# Patient Record
Sex: Male | Born: 1965 | Race: Black or African American | Hispanic: No | Smoking: Former smoker
Health system: Southern US, Community
[De-identification: ages and names within clinical notes are randomized; demographics above are authoritative.]

## PROBLEM LIST (undated history)

## (undated) DIAGNOSIS — L729 Follicular cyst of the skin and subcutaneous tissue, unspecified: Secondary | ICD-10-CM

## (undated) DIAGNOSIS — I10 Essential (primary) hypertension: Secondary | ICD-10-CM

## (undated) HISTORY — PX: CYST EXCISION: SHX5701

---

## 1999-12-20 ENCOUNTER — Encounter: Payer: Self-pay | Admitting: Emergency Medicine

## 1999-12-20 ENCOUNTER — Emergency Department (HOSPITAL_COMMUNITY): Admission: EM | Admit: 1999-12-20 | Discharge: 1999-12-20 | Payer: Self-pay | Admitting: *Deleted

## 2005-09-24 ENCOUNTER — Emergency Department (HOSPITAL_COMMUNITY): Admission: EM | Admit: 2005-09-24 | Discharge: 2005-09-24 | Payer: Self-pay | Admitting: Family Medicine

## 2009-05-31 ENCOUNTER — Emergency Department (HOSPITAL_COMMUNITY): Admission: EM | Admit: 2009-05-31 | Discharge: 2009-05-31 | Payer: Self-pay | Admitting: Emergency Medicine

## 2009-06-09 ENCOUNTER — Ambulatory Visit: Payer: Self-pay | Admitting: Internal Medicine

## 2009-06-10 ENCOUNTER — Encounter (INDEPENDENT_AMBULATORY_CARE_PROVIDER_SITE_OTHER): Payer: Self-pay | Admitting: Internal Medicine

## 2009-07-04 ENCOUNTER — Ambulatory Visit: Payer: Self-pay | Admitting: Family Medicine

## 2009-07-04 LAB — CONVERTED CEMR LAB
ALT: 18 units/L (ref 0–53)
AST: 23 units/L (ref 0–37)
Albumin: 4.3 g/dL (ref 3.5–5.2)
Alkaline Phosphatase: 60 units/L (ref 39–117)
BUN: 10 mg/dL (ref 6–23)
Basophils Absolute: 0 10*3/uL (ref 0.0–0.1)
Basophils Relative: 0 % (ref 0–1)
CO2: 22 meq/L (ref 19–32)
Calcium: 8.9 mg/dL (ref 8.4–10.5)
Chloride: 105 meq/L (ref 96–112)
Cholesterol: 146 mg/dL (ref 0–200)
Creatinine, Ser: 0.92 mg/dL (ref 0.40–1.50)
Eosinophils Absolute: 0.1 10*3/uL (ref 0.0–0.7)
Eosinophils Relative: 1 % (ref 0–5)
Glucose, Bld: 90 mg/dL (ref 70–99)
HCT: 42.5 % (ref 39.0–52.0)
HDL: 63 mg/dL (ref >39)
Hemoglobin: 14.2 g/dL (ref 13.0–17.0)
LDL Cholesterol: 74 mg/dL (ref 0–99)
Lymphocytes Relative: 54 % — ABNORMAL HIGH (ref 12–46)
Lymphs Abs: 3 10*3/uL (ref 0.7–4.0)
MCHC: 33.4 g/dL (ref 30.0–36.0)
MCV: 88.5 fL (ref 78.0–100.0)
Monocytes Absolute: 0.3 10*3/uL (ref 0.1–1.0)
Monocytes Relative: 6 % (ref 3–12)
Neutro Abs: 2.2 10*3/uL (ref 1.7–7.7)
Neutrophils Relative %: 39 % — ABNORMAL LOW (ref 43–77)
Platelets: 225 10*3/uL (ref 150–400)
Potassium: 4.3 meq/L (ref 3.5–5.3)
RBC: 4.8 M/uL (ref 4.22–5.81)
RDW: 15.1 % (ref 11.5–15.5)
Sodium: 139 meq/L (ref 135–145)
TSH: 0.864 microintl units/mL (ref 0.350–4.500)
Total Bilirubin: 0.4 mg/dL (ref 0.3–1.2)
Total CHOL/HDL Ratio: 2.3
Total Protein: 7 g/dL (ref 6.0–8.3)
Triglycerides: 47 mg/dL (ref <150)
VLDL: 9 mg/dL (ref 0–40)
Vit D, 25-Hydroxy: 13 ng/mL — ABNORMAL LOW (ref 30–89)
WBC: 5.6 10*3/uL (ref 4.0–10.5)

## 2009-07-11 ENCOUNTER — Ambulatory Visit: Payer: Self-pay | Admitting: Family Medicine

## 2010-04-26 ENCOUNTER — Emergency Department (HOSPITAL_COMMUNITY)
Admission: EM | Admit: 2010-04-26 | Discharge: 2010-04-26 | Payer: Self-pay | Source: Home / Self Care | Admitting: Emergency Medicine

## 2010-07-25 LAB — POCT I-STAT, CHEM 8
BUN: 9 mg/dL (ref 6–23)
Calcium, Ion: 1.07 mmol/L — ABNORMAL LOW (ref 1.12–1.32)
Chloride: 108 mEq/L (ref 96–112)
Creatinine, Ser: 1.1 mg/dL (ref 0.4–1.5)
Glucose, Bld: 90 mg/dL (ref 70–99)
HCT: 46 % (ref 39.0–52.0)
Hemoglobin: 15.6 g/dL (ref 13.0–17.0)
Potassium: 4.4 mEq/L (ref 3.5–5.1)
Sodium: 141 mEq/L (ref 135–145)
TCO2: 28 mmol/L (ref 0–100)

## 2010-07-31 LAB — CBC
HCT: 42.5 % (ref 39.0–52.0)
Hemoglobin: 14.2 g/dL (ref 13.0–17.0)
MCHC: 33.5 g/dL (ref 30.0–36.0)
MCV: 94.3 fL (ref 78.0–100.0)
Platelets: 204 10*3/uL (ref 150–400)
RDW: 13.8 % (ref 11.5–15.5)
WBC: 5.6 10*3/uL (ref 4.0–10.5)

## 2010-07-31 LAB — POCT I-STAT, CHEM 8
HCT: 45 % (ref 39.0–52.0)
Hemoglobin: 15.3 g/dL (ref 13.0–17.0)
Potassium: 4.1 mEq/L (ref 3.5–5.1)
Sodium: 140 mEq/L (ref 135–145)
TCO2: 28 mmol/L (ref 0–100)

## 2012-01-12 IMAGING — CT CT ORBITS W/ CM
3 series · 17 of 47 positions shown, 20 images · IV contrast (CONTRAST)
Comparison: CT orbits of 05/31/2009

CLINICAL DATA: Right eye swelling

CT ORBITS WITH CONTRAST
TECHNIQUE: Multidetector CT imaging of the orbits was performed
following the bolus administration of intravenous contrast.
Contrast: 100 ml [id number]-O66

[Series 3: orbits bone · axial · 0.35mm/px · z∈[+160,+236]mm · 11 of 44 slices shown, 14 images]
[im 3/44  brain]
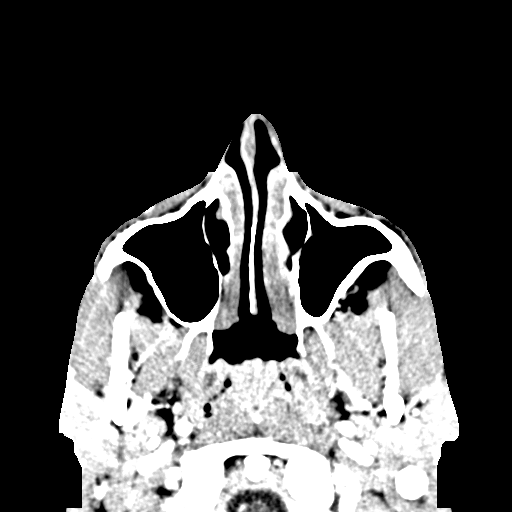
[im 3/44  bone]
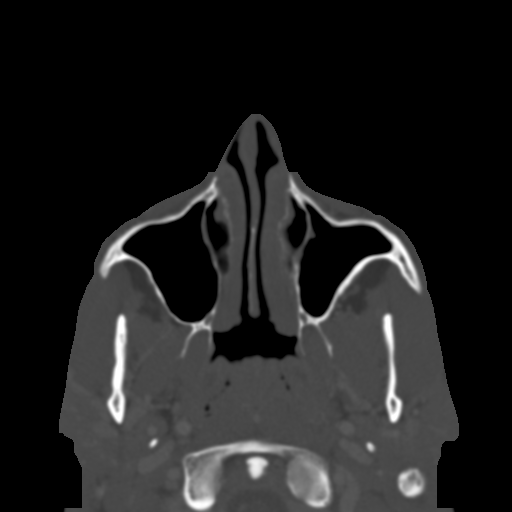
[im 6/44  bone]
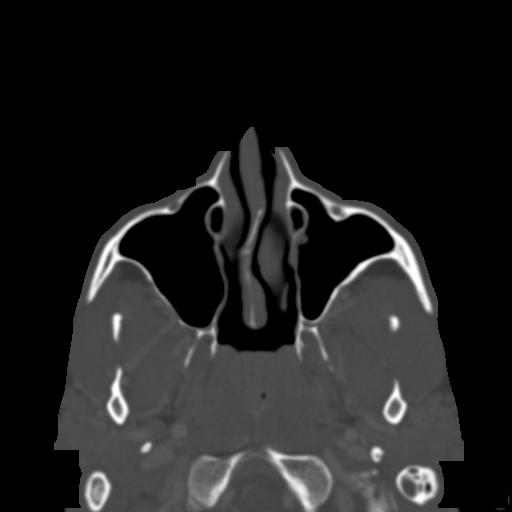
[im 11/44  bone]
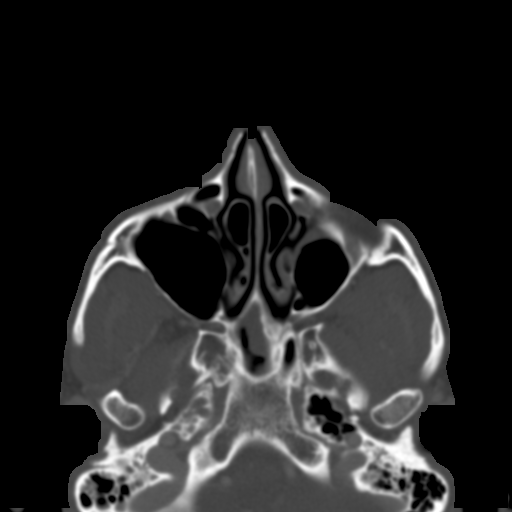
[im 14/44  bone]
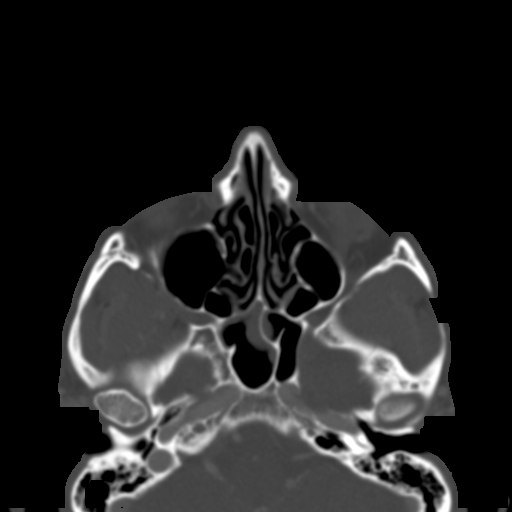
[im 18/44  brain]
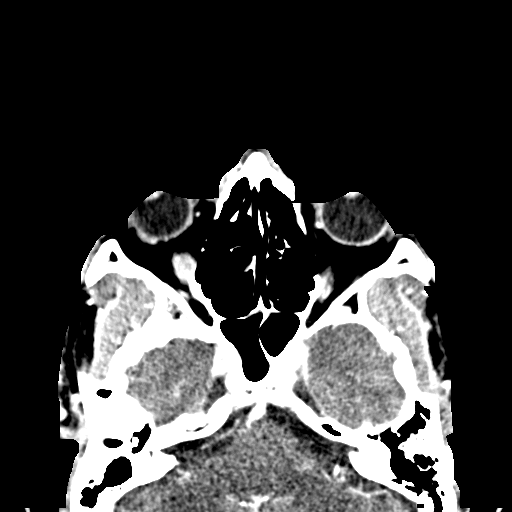
[im 18/44  bone]
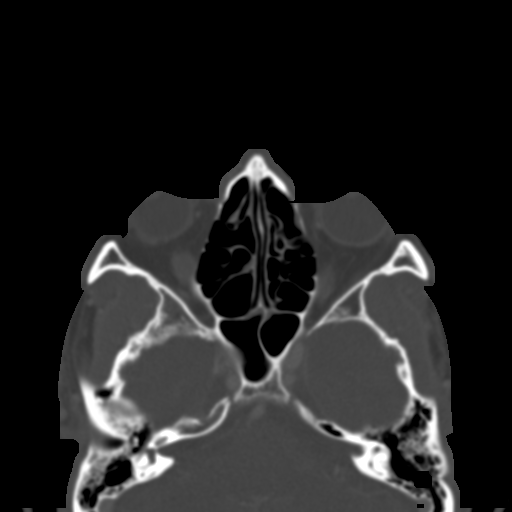
[im 23/44  bone]
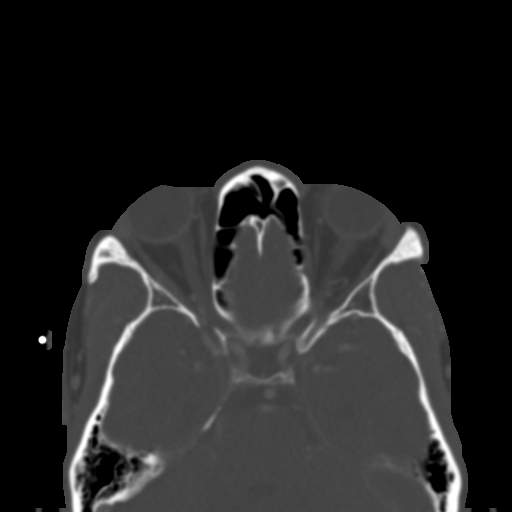
[im 26/44  bone]
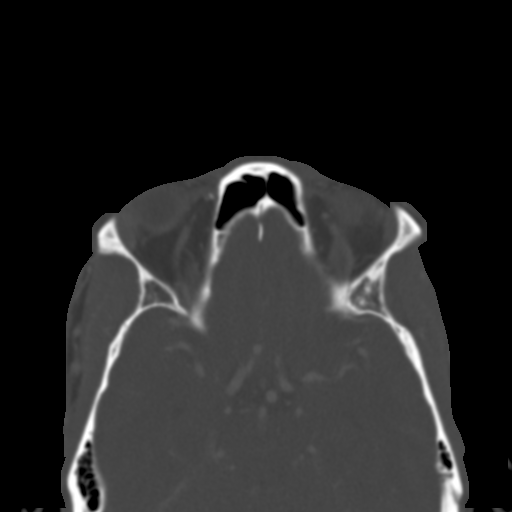
[im 30/44  bone]
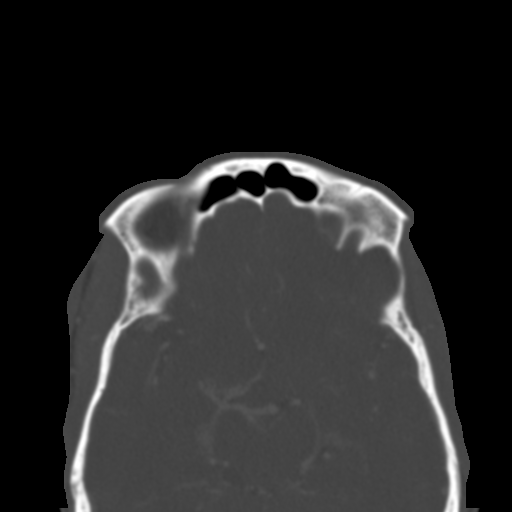
[im 33/44  brain]
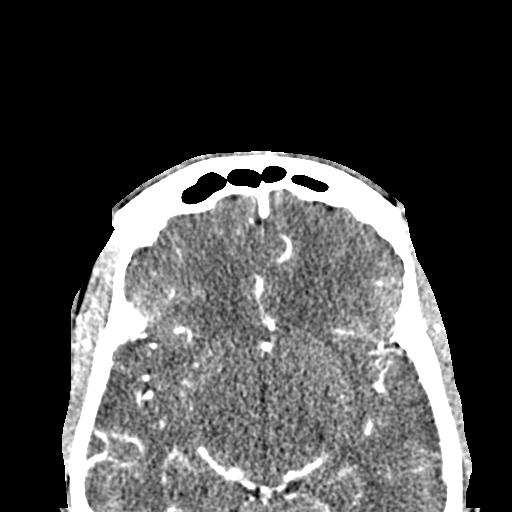
[im 33/44  bone]
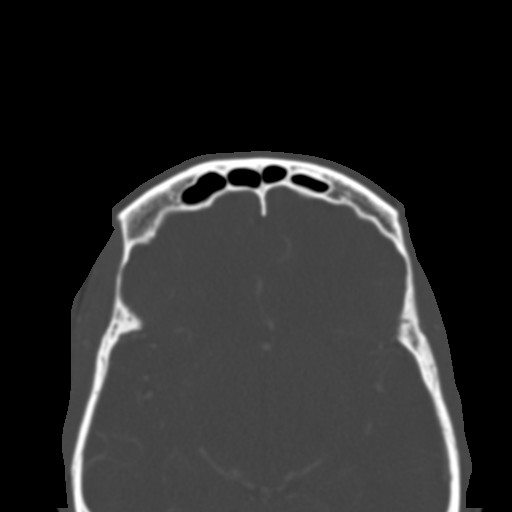
[im 38/44  bone]
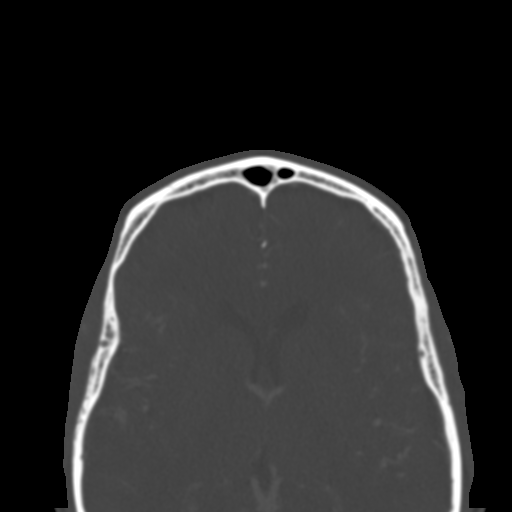
[im 41/44  bone]
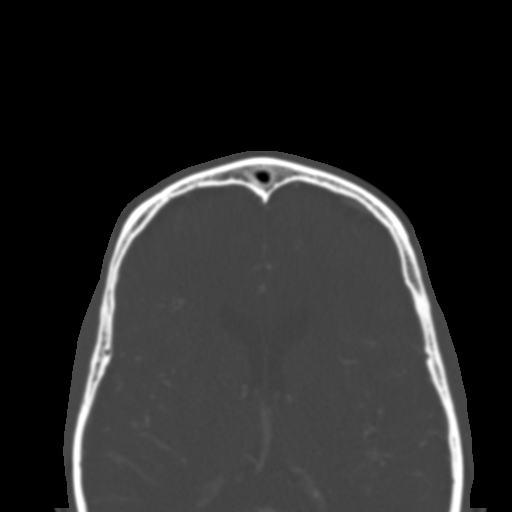

[mpr, coronal std, coronal · coronal · 0.35mm/px · 3 of 63 slices shown]
[im 21/63  bone]
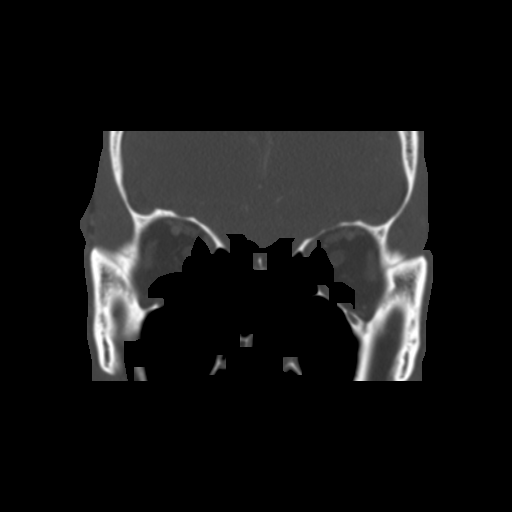
[im 28/63  bone]
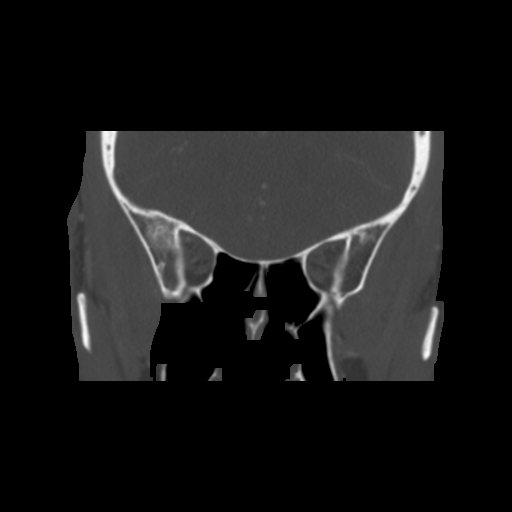
[im 35/63  bone]
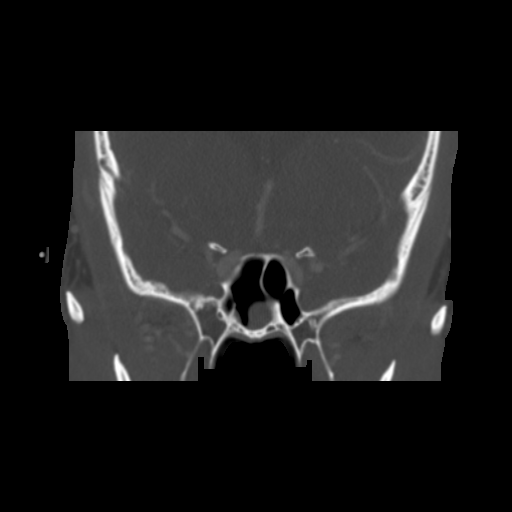

[mpr, sagittal std, sagittal · sagittal · 0.35mm/px · 3 of 72 slices shown]
[im 24/72  bone]
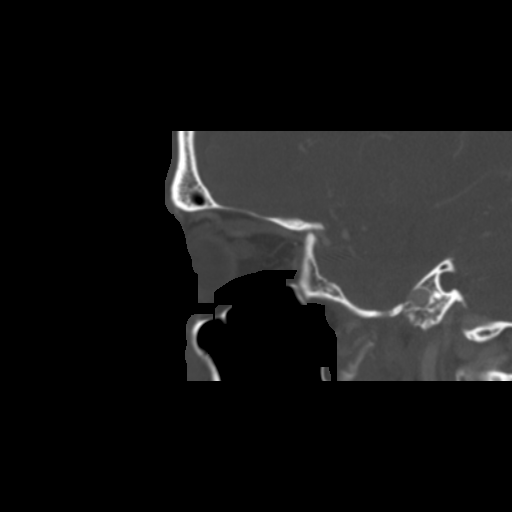
[im 36/72  bone]
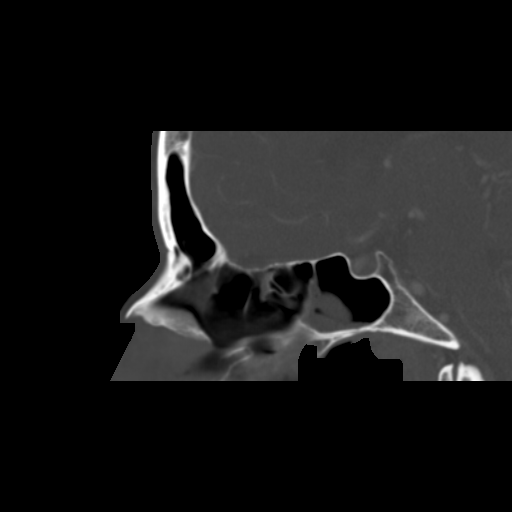
[im 48/72  bone]
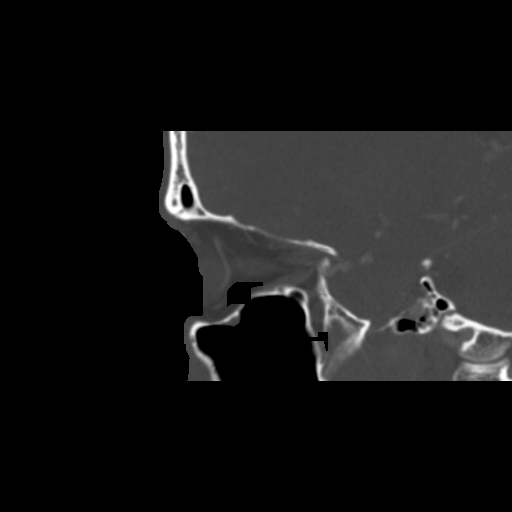

[17 of 47 positions shown; findings below may reference images not displayed]

FINDINGS: The maxillary sinuses are clear.  There is minimal
mucosal thickening in the right partition of the sphenoid sinus.
The globes appear normal and there is no evidence of post septal
cellulitis extension.  The cavernous sinus enhances with no
abnormality noted.  The ethmoid and frontal sinuses are
pneumatized.  No bony abnormality is seen.
IMPRESSION: Negative CT of the orbits.  No post septal cellulitis is seen.
Mild mucosal thickening in the sphenoid sinus.

## 2012-03-17 ENCOUNTER — Emergency Department (HOSPITAL_COMMUNITY)
Admission: EM | Admit: 2012-03-17 | Discharge: 2012-03-17 | Disposition: A | Discharge status: Home / Self Care | Payer: Self-pay | Attending: Emergency Medicine | Admitting: Emergency Medicine

## 2012-03-17 ENCOUNTER — Encounter (HOSPITAL_COMMUNITY): Payer: Self-pay | Admitting: Emergency Medicine

## 2012-03-17 DIAGNOSIS — Z87891 Personal history of nicotine dependence: Secondary | ICD-10-CM | POA: Insufficient documentation

## 2012-03-17 DIAGNOSIS — L03213 Periorbital cellulitis: Secondary | ICD-10-CM

## 2012-03-17 DIAGNOSIS — H05019 Cellulitis of unspecified orbit: Secondary | ICD-10-CM | POA: Insufficient documentation

## 2012-03-17 MED ORDER — AMOXICILLIN-POT CLAVULANATE 875-125 MG PO TABS: 1.0000 | ORAL_TABLET | Freq: Two times a day (BID) | ORAL | Status: DC

## 2012-03-17 MED ORDER — AMOXICILLIN-POT CLAVULANATE 875-125 MG PO TABS
1.0000 | ORAL_TABLET | Freq: Once | ORAL | Status: AC
  Administered 2012-03-17: 1 via ORAL
  Filled 2012-03-17: qty 1

## 2012-03-17 NOTE — ED Provider Notes (Signed)
History  This chart was scribed for Suzi Roots, MD by Shari Heritage. The patient was seen in room TR02C/TR02C. Patient's care was started at 1028.     CSN: [id number]  Arrival date & time 03/17/12  4098   First MD Initiated Contact with Patient 03/17/12 1028      Chief Complaint  Patient presents with  . Facial Swelling    The history is provided by the patient. No language interpreter was used.   HPI Comments; Jonathon Chaney is a 46 y.o. male who presents to the Emergency Department complaining of redness and swelling below left eye onset 2-3 hours ago. No  pain to eye.. Patient says that he woke up this morning and noticed symptoms, but was fine when she went to bed last night. He denies any scratches to the eye, obvious injury or trauma. Patient has a hx similar symptoms beneath right eye in past years. Patient has a hx of cysts, but only one has ever been lanced and he has not had any recently. He states that antibiotics usually provide relief of symptoms. No recent sore throat, sinus drainage, fever, chills. No nausea or vomiting. No hx MRSA. Former [facility] patient who only has intermittent primary care. Does not feel ill. No fevers. No gen malaise. No headaches.   History reviewed. No pertinent past medical history.  History reviewed. No pertinent past surgical history.  History reviewed. No pertinent family history.  History  Substance Use Topics  . Smoking status: Former Games developer  . Smokeless tobacco: Not on file  . Alcohol Use: Yes      Review of Systems  Constitutional: Negative for fever and chills.  HENT: Positive for facial swelling (left eye). Negative for congestion, sore throat and rhinorrhea.   Eyes: Negative for pain, redness and visual disturbance.  Gastrointestinal: Negative for nausea and vomiting.    Allergies  Review of patient's allergies indicates no known allergies.  Home Medications  No current outpatient prescriptions on file.  BP  135/85  Pulse 65  Temp 97.8 F (36.6 C) (Oral)  Resp 20  SpO2 99%  Physical Exam  Nursing note and vitals reviewed. Constitutional: He is oriented to person, place, and time. He appears well-developed and well-nourished. No distress.  HENT:  Head: Normocephalic and atraumatic.       Several non tender, non inflamed, subcutaneous cysts to face, largest right lower face, non tender, no erythema (pt notes present x months/years without acute change). Pt with mild cellulitis beneath left eye. No fluctuance/cyst/abscess to this area.   Eyes: Conjunctivae normal and EOM are normal. Pupils are equal, round, and reactive to light. Right eye exhibits normal extraocular motion. Left eye exhibits normal extraocular motion.       Erythema beneath the left eye. Conjunctiva normal. No pain with EOM. No eye pain itself. No proptosis.    Neck: Normal range of motion. Neck supple.  Cardiovascular: Normal rate.   Pulmonary/Chest: Effort normal.  Musculoskeletal: Normal range of motion.  Lymphadenopathy:    He has no cervical adenopathy.  Neurological: He is alert and oriented to person, place, and time.  Skin: Skin is warm and dry. No rash noted.  Psychiatric: He has a normal mood and affect. His behavior is normal.   ED Course  Procedures (including critical care time) DIAGNOSTIC STUDIES: Oxygen Saturation is 99% on room air, normal by my interpretation.    COORDINATION OF CARE: 10:33am- Patient informed of current plan for treatment and evaluation and agrees  with plan at this time.       MDM  I personally performed the services described in this documentation, which was scribed in my presence. The recorded information has been reviewed and considered. Suzi Roots, MD  Pt w hx recurrent facial cysts - reviewed prior cts from 2 years ago. Pt states cysts are always there, not painful/not tender, no infection, but on atleast a couple of occasions has developed skin redness/soreness (on right  side previously) - stating his symptoms previously resolved w po antibiotics.  Pt states has a few cystic/swollen/not painful areas, largest of which is on right lower face x months/years. Denies hx mrsa or other boils/abscesses needing drainage.   Today w mild cellulitis beneath left eye. No area of fluctuance/abscess noted. No eye pain or pain w eom - there does not appear to be any orbital involvement, but rather facial/periorbital cellulitis beneath left eye.   Confirmed nkda w pt. rx augmentin.   Suzi Roots, MD 03/17/12 1047

## 2012-03-17 NOTE — ED Notes (Signed)
Pt woke up this am to redness and edema below left eye. Pt A&Ox4, ambulatory.

## 2012-07-08 ENCOUNTER — Encounter (HOSPITAL_COMMUNITY): Payer: Self-pay | Admitting: Emergency Medicine

## 2012-07-08 ENCOUNTER — Emergency Department (HOSPITAL_COMMUNITY)
Admission: EM | Admit: 2012-07-08 | Discharge: 2012-07-08 | Disposition: A | Discharge status: Home / Self Care | Payer: Self-pay | Attending: Emergency Medicine | Admitting: Emergency Medicine

## 2012-07-08 DIAGNOSIS — Z87891 Personal history of nicotine dependence: Secondary | ICD-10-CM | POA: Insufficient documentation

## 2012-07-08 DIAGNOSIS — L729 Follicular cyst of the skin and subcutaneous tissue, unspecified: Secondary | ICD-10-CM

## 2012-07-08 DIAGNOSIS — L723 Sebaceous cyst: Secondary | ICD-10-CM | POA: Insufficient documentation

## 2012-07-08 HISTORY — DX: Follicular cyst of the skin and subcutaneous tissue, unspecified: L72.9

## 2012-07-08 MED ORDER — OXYCODONE-ACETAMINOPHEN 5-325 MG PO TABS: 1.0000 | ORAL_TABLET | ORAL | Status: DC | PRN

## 2012-07-08 MED ORDER — DOXYCYCLINE HYCLATE 100 MG PO CAPS: 100.0000 mg | ORAL_CAPSULE | Freq: Two times a day (BID) | ORAL | Status: DC

## 2012-07-08 MED ORDER — OXYCODONE-ACETAMINOPHEN 5-325 MG PO TABS
2.0000 | ORAL_TABLET | Freq: Once | ORAL | Status: AC
  Administered 2012-07-08: 2 via ORAL
  Filled 2012-07-08: qty 2

## 2012-07-08 NOTE — ED Provider Notes (Signed)
Shelda Jakes, MD   Medical screening examination/treatment/procedure(s) were conducted as a shared visit with non-physician practitioner(s) and myself.  I personally evaluated the patient during the encounter  Patient seen by me. 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 Has dermatology followup in the next one to 2 weeks. Patient has to the cyst that are chronic with acute infection on top of it on the right side of his face. Recommend not I&D in the either one will treat with antibiotics and warm compresses. Large one on the cheek is not ready for I&D the one up around the eye possibly could benefit from I&D it may drain on its own but this will help minimize scarring but not I&D normal the cyst on his face. Will treat with doxycycline warm compresses and medication for pain.  Shelda Jakes, MD 07/08/12 1325

## 2012-07-08 NOTE — ED Notes (Signed)
Pt c/o multiple cysts on face. Pt has history of cysts to face. Pt woke up with a new cyst on right side of face.

## 2012-07-21 NOTE — Progress Notes (Signed)
Local procedure-light breakfast-arrive 7am-bring ID and ins. card

## 2012-07-22 NOTE — ED Provider Notes (Signed)
History     CSN: [id number]  Arrival date & time 07/08/12  1017   First MD Initiated Contact with Patient 07/08/12 1300      Chief Complaint  Patient presents with  . other     Knots on face    (Consider location/radiation/quality/duration/timing/severity/associated sxs/prior treatment) HPI Comments: 47 y.o. Male presents with acute on chronic facial cysts that predominantly occur on his face. Today has cyst over right eyebrow and on right cheek. Has dermatology followup in the next one to 2 weeks. Pt said that the pain occurred suddenly overnight and he just couldn't bear it anymore. Pt has taken no interventions. Nothing makes it better or worse. Pain is severe. Localized.   Patient is a 47 y.o. male presenting with abscess. The history is provided by the patient.  Abscess Location:  Face Facial abscess location:  R eyebrow and R cheek Abscess quality: fluctuance and painful   Abscess quality: not draining, no redness and no warmth   Red streaking: no   Progression:  Worsening Pain details:    Quality:  Dull   Severity:  Severe   Timing:  Constant   Progression:  Worsening Chronicity:  Recurrent Relieved by:  None tried Worsened by:  Nothing tried Ineffective treatments:  None tried Associated symptoms: no fever, no headaches, no nausea and no vomiting   Risk factors: prior abscess     Past Medical History  Diagnosis Date  . Cyst of skin     Past Surgical History  Procedure Laterality Date  . Cyst excision      No family history on file.  History  Substance Use Topics  . Smoking status: Former Games developer  . Smokeless tobacco: Not on file  . Alcohol Use: Yes     Comment: mod      Review of Systems  Constitutional: Negative for fever and diaphoresis.  HENT: Negative for neck pain and neck stiffness.   Eyes: Negative for visual disturbance.  Respiratory: Negative for apnea, chest tightness and shortness of breath.   Cardiovascular: Negative for chest pain  and palpitations.  Gastrointestinal: Negative for nausea, vomiting, diarrhea and constipation.  Genitourinary: Negative for dysuria.  Musculoskeletal: Negative for gait problem.  Skin:       Two cysts on right side of face, one above eyebrow, one on right cheek  Neurological: Negative for dizziness, weakness, light-headedness, numbness and headaches.    Allergies  Review of patient's allergies indicates no known allergies.  Home Medications   Current Outpatient Rx  Name  Route  Sig  Dispense  Refill  . doxycycline (VIBRAMYCIN) 100 MG capsule   Oral   Take 1 capsule (100 mg total) by mouth 2 (two) times daily.   20 capsule   0   . oxyCODONE-acetaminophen (PERCOCET) 5-325 MG per tablet   Oral   Take 1 tablet by mouth every 4 (four) hours as needed for pain.   20 tablet   0     BP 165/93  Pulse 97  Temp(Src) 98.1 F (36.7 C) (Oral)  Resp 20  Ht 5\' 9"  (1.753 m)  Wt 155 lb (70.308 kg)  BMI 22.88 kg/m2  SpO2 96%  Physical Exam  Nursing note and vitals reviewed. Constitutional: He is oriented to person, place, and time. He appears well-developed and well-nourished. No distress.  HENT:  Head: Normocephalic and atraumatic.  Eyes: EOM are normal. Pupils are equal, round, and reactive to light.  Neck: Normal range of motion. Neck supple.  No  meningeal signs  Cardiovascular: Normal rate, regular rhythm and normal heart sounds.  Exam reveals no gallop and no friction rub.   No murmur heard. Pulmonary/Chest: Effort normal and breath sounds normal. No respiratory distress. He has no wheezes. He has no rales. He exhibits no tenderness.  Abdominal: Soft. Bowel sounds are normal. He exhibits no distension. There is no tenderness. There is no rebound and no guarding.  Musculoskeletal: Normal range of motion. He exhibits no edema and no tenderness.  Neurological: He is alert and oriented to person, place, and time. No cranial nerve deficit.  Skin: Skin is warm and dry. He is not  diaphoretic. No erythema.  One cyst over right eyebrow, one on right cheek. No warmth. No redness. Painful to touch. Fluctuant. Not purulent.     ED Course  Procedures (including critical care time)  Labs Reviewed - No data to display No results found.   1. Skin cysts, generalized       MDM  Recurrent problem for the patient. Consulted with Dr. Deretha Emory as to I&D who recommended no I&D at this time.  Will recommend antibiotics and warm compresses with the hope that they drain on their own to prevent scarring on the face. Will provided prescription for doxycycline and pain meds. Direct pt to use warm compresses and follow up with his dermatologist.  At this time there does not appear to be any evidence of an acute emergency medical condition and the patient appears stable for discharge with appropriate outpatient follow up.Diagnosis was discussed with patient who verbalizes understanding and is agreeable to discharge. Pt case discussed with Dr. Deretha Emory who agrees with the plan.       Glade Nurse, PA-C 07/22/12 [phone]

## 2012-07-22 NOTE — ED Provider Notes (Signed)
Medical screening examination/treatment/procedure(s) were conducted as a shared visit with non-physician practitioner(s) and myself.  I personally evaluated the patient during the encounter  Shelda Jakes, MD 07/22/12 [phone]

## 2012-07-24 NOTE — H&P (Signed)
PREOPERATIVE H&P  Chief Complaint: facial cyst  HPI: Jonathon Chaney is a 47 y.o. male who presents for evaluation of multiple facial cyst he's had for the last 3 years that have gradually gotten larger. He's taken to the OR for excision under local anesthesia.  Past Medical History  Diagnosis Date  . Cyst of skin    Past Surgical History  Procedure Laterality Date  . Cyst excision     History   Social History  . Marital Status: Legally Separated    Spouse Name: N/A    Number of Children: N/A  . Years of Education: N/A   Social History Main Topics  . Smoking status: Former Games developer  . Smokeless tobacco: Not on file  . Alcohol Use: Yes     Comment: mod  . Drug Use: No  . Sexually Active: Not on file   Other Topics Concern  . Not on file   Social History Narrative  . No narrative on file   No family history on file. No Known Allergies Prior to Admission medications   Medication Sig Start Date End Date Taking? Authorizing Provider  doxycycline (VIBRAMYCIN) 100 MG capsule Take 1 capsule (100 mg total) by mouth 2 (two) times daily. 07/08/12   Glade Nurse, PA-C  oxyCODONE-acetaminophen (PERCOCET) 5-325 MG per tablet Take 1 tablet by mouth every 4 (four) hours as needed for pain. 07/08/12   Glade Nurse, PA-C     Positive ROS: facial cyst without symptoms  All other systems have been reviewed and were otherwise negative with the exception of those mentioned in the HPI and as above.  Physical Exam: There were no vitals filed for this visit.  General: Alert, no acute distress Oral: Normal oral mucosa and tonsils Nasal: Clear nasal passages Face: 3.5 cm right cheek cyst, 2 cm left cheek cyst , 2cm right eyebrow cyst Neck: No palpable adenopathy or thyroid nodules Ear: Ear canal is clear with normal appearing TMs Cardiovascular: Regular rate and rhythm, no murmur.  Respiratory: Clear to auscultation Neurologic: Alert and oriented x 3   Assessment/Plan: FACIAL CYST  X3 Plan for Procedure(s): FACIAL CYST X3 WITH CLOSURE   Dillard Cannon, MD 07/24/2012 9:34 AM

## 2012-07-25 ENCOUNTER — Encounter (HOSPITAL_BASED_OUTPATIENT_CLINIC_OR_DEPARTMENT_OTHER)
Admission: RE | Disposition: A | Discharge status: Home / Self Care | Payer: Self-pay | Source: Ambulatory Visit | Attending: Otolaryngology

## 2012-07-25 ENCOUNTER — Ambulatory Visit (HOSPITAL_BASED_OUTPATIENT_CLINIC_OR_DEPARTMENT_OTHER)
Admission: RE | Admit: 2012-07-25 | Discharge: 2012-07-25 | Disposition: A | Discharge status: Home / Self Care | Payer: PRIVATE HEALTH INSURANCE | Source: Ambulatory Visit | Attending: Otolaryngology | Admitting: Otolaryngology

## 2012-07-25 DIAGNOSIS — L723 Sebaceous cyst: Secondary | ICD-10-CM | POA: Insufficient documentation

## 2012-07-25 DIAGNOSIS — Z87891 Personal history of nicotine dependence: Secondary | ICD-10-CM | POA: Insufficient documentation

## 2012-07-25 HISTORY — PX: MASS EXCISION: SHX2000

## 2012-07-25 SURGERY — MINOR EXCISION OF MASS
Anesthesia: LOCAL | Site: Face | Laterality: Bilateral | Wound class: Clean

## 2012-07-25 MED ORDER — LIDOCAINE-EPINEPHRINE 1 %-1:100000 IJ SOLN
INTRAMUSCULAR | Status: DC | PRN
  Administered 2012-07-25: 8 mL

## 2012-07-25 MED ORDER — CEPHALEXIN 500 MG PO CAPS: 500.0000 mg | ORAL_CAPSULE | Freq: Three times a day (TID) | ORAL | Status: AC

## 2012-07-25 SURGICAL SUPPLY — 56 items
ADH SKN CLS APL DERMABOND .7 (GAUZE/BANDAGES/DRESSINGS) ×2
APL SKNCLS STERI-STRIP NONHPOA (GAUZE/BANDAGES/DRESSINGS)
BALL CTTN LRG ABS STRL LF (GAUZE/BANDAGES/DRESSINGS)
BANDAGE ADHESIVE 1X3 (GAUZE/BANDAGES/DRESSINGS) IMPLANT
BENZOIN TINCTURE PRP APPL 2/3 (GAUZE/BANDAGES/DRESSINGS) IMPLANT
BLADE SURG 15 STRL LF DISP TIS (BLADE) ×1 IMPLANT
BLADE SURG 15 STRL SS (BLADE) ×2
CANISTER SUCTION 1200CC (MISCELLANEOUS) IMPLANT
CAUTERY EYE LOW TEMP 1300F FIN (OPHTHALMIC RELATED) IMPLANT
CLEANER CAUTERY TIP 5X5 PAD (MISCELLANEOUS) IMPLANT
CLOTH BEACON ORANGE TIMEOUT ST (SAFETY) ×2 IMPLANT
COTTONBALL LRG STERILE PKG (GAUZE/BANDAGES/DRESSINGS) IMPLANT
DECANTER SPIKE VIAL GLASS SM (MISCELLANEOUS) IMPLANT
DERMABOND ADVANCED (GAUZE/BANDAGES/DRESSINGS) ×2
DERMABOND ADVANCED .7 DNX12 (GAUZE/BANDAGES/DRESSINGS) IMPLANT
DRSG TEGADERM 4X4.75 (GAUZE/BANDAGES/DRESSINGS) IMPLANT
ELECT COATED BLADE 2.86 ST (ELECTRODE) ×2 IMPLANT
ELECT REM PT RETURN 9FT ADLT (ELECTROSURGICAL) ×2
ELECTRODE REM PT RTRN 9FT ADLT (ELECTROSURGICAL) ×1 IMPLANT
GAUZE SPONGE 4X4 12PLY STRL LF (GAUZE/BANDAGES/DRESSINGS) IMPLANT
GAUZE SPONGE 4X4 16PLY XRAY LF (GAUZE/BANDAGES/DRESSINGS) IMPLANT
GLOVE ECLIPSE 6.5 STRL STRAW (GLOVE) ×1 IMPLANT
GLOVE INDICATOR 7.0 STRL GRN (GLOVE) ×1 IMPLANT
GLOVE SS BIOGEL STRL SZ 7.5 (GLOVE) ×1 IMPLANT
GLOVE SUPERSENSE BIOGEL SZ 7.5 (GLOVE) ×2
GOWN PREVENTION PLUS XLARGE (GOWN DISPOSABLE) IMPLANT
MARKER SKIN DUAL TIP RULER LAB (MISCELLANEOUS) IMPLANT
NEEDLE 27GAX1X1/2 (NEEDLE) ×2 IMPLANT
NS IRRIG 1000ML POUR BTL (IV SOLUTION) ×1 IMPLANT
PACK BASIN DAY SURGERY FS (CUSTOM PROCEDURE TRAY) IMPLANT
PAD CLEANER CAUTERY TIP 5X5 (MISCELLANEOUS)
PENCIL BUTTON HOLSTER BLD 10FT (ELECTRODE) ×2 IMPLANT
SHEET MEDIUM DRAPE 40X70 STRL (DRAPES) IMPLANT
SPONGE GAUZE 4X4 12PLY (GAUZE/BANDAGES/DRESSINGS) ×3 IMPLANT
SPONGE INTESTINAL PEANUT (DISPOSABLE) IMPLANT
STRIP CLOSURE SKIN 1/2X4 (GAUZE/BANDAGES/DRESSINGS) IMPLANT
STRIP CLOSURE SKIN 1/4X4 (GAUZE/BANDAGES/DRESSINGS) IMPLANT
SUCTION FRAZIER TIP 10 FR DISP (SUCTIONS) IMPLANT
SUT CHROMIC 3 0 PS 2 (SUTURE) IMPLANT
SUT CHROMIC 4 0 P 3 18 (SUTURE) ×1 IMPLANT
SUT ETHILON 5 0 P 3 18 (SUTURE)
SUT ETHILON 6 0 P 1 (SUTURE) IMPLANT
SUT NYLON ETHILON 5-0 P-3 1X18 (SUTURE) IMPLANT
SUT PLAIN 5 0 P 3 18 (SUTURE) IMPLANT
SUT SILK 4 0 TIES 17X18 (SUTURE) IMPLANT
SUT VIC AB 4-0 P-3 18XBRD (SUTURE) IMPLANT
SUT VIC AB 4-0 P3 18 (SUTURE)
SWAB COLLECTION DEVICE MRSA (MISCELLANEOUS) IMPLANT
SWABSTICK POVIDONE IODINE SNGL (MISCELLANEOUS) ×4 IMPLANT
SYR BULB 3OZ (MISCELLANEOUS) ×1 IMPLANT
SYR CONTROL 10ML LL (SYRINGE) ×2 IMPLANT
TOWEL OR 17X24 6PK STRL BLUE (TOWEL DISPOSABLE) ×3 IMPLANT
TRAY DSU PREP LF (CUSTOM PROCEDURE TRAY) IMPLANT
TUBE ANAEROBIC SPECIMEN COL (MISCELLANEOUS) IMPLANT
TUBE CONNECTING 20X1/4 (TUBING) IMPLANT
YANKAUER SUCT BULB TIP NO VENT (SUCTIONS) IMPLANT

## 2012-07-25 NOTE — Brief Op Note (Signed)
07/25/2012  9:15 AM  PATIENT:  Jonathon Chaney  47 y.o. male  PRE-OPERATIVE DIAGNOSIS:  FACIAL CYST X3  POST-OPERATIVE DIAGNOSIS:  FACIAL CYST X3  PROCEDURE:  Procedure(s): FACIAL CYST X3 WITH CLOSURE (Bilateral)  SURGEON:  Surgeon(s) and Role:    * Drema Halon, MD - Primary  PHYSICIAN ASSISTANT:   ASSISTANTS: none   ANESTHESIA:   local  EBL:     BLOOD ADMINISTERED:none  DRAINS: none   LOCAL MEDICATIONS USED:  XYLOCAINE   SPECIMEN:  Source of Specimen:  right and left cheek cyst  DISPOSITION OF SPECIMEN:  PATHOLOGY  COUNTS:  YES  TOURNIQUET:  * No tourniquets in log *  DICTATION: .Other Dictation: Dictation Number [id number]  PLAN OF CARE: Discharge to home after PACU  PATIENT DISPOSITION:  PACU - hemodynamically stable.   Delay start of Pharmacological VTE agent (>24hrs) due to surgical blood loss or risk of bleeding: not applicable

## 2012-07-25 NOTE — Interval H&P Note (Signed)
History and Physical Interval Note:  07/25/2012 7:29 AM  Jonathon Chaney  has presented today for surgery, with the diagnosis of FACIAL CYST X3  The various methods of treatment have been discussed with the patient and family. After consideration of risks, benefits and other options for treatment, the patient has consented to  Procedure(s): FACIAL CYST X3 WITH CLOSURE (Bilateral) as a surgical intervention .  The patient's history has been reviewed, patient examined, no change in status, stable for surgery.  I have reviewed the patient's chart and labs.  Questions were answered to the patient's satisfaction.     NEWMAN, CHRISTOPHER

## 2012-07-28 ENCOUNTER — Encounter (HOSPITAL_BASED_OUTPATIENT_CLINIC_OR_DEPARTMENT_OTHER): Payer: Self-pay | Admitting: Otolaryngology

## 2012-07-28 NOTE — Op Note (Signed)
Jonathon Chaney, Jonathon Chaney                ACCOUNT NO.:  [id number]  MEDICAL RECORD NO.:  [id number]  LOCATION:                                 FACILITY:  PHYSICIAN:  Kristine Garbe. Ezzard Standing, M.D.DATE OF BIRTH:  April 07, 1966  DATE OF PROCEDURE:  07/25/2012 DATE OF DISCHARGE:                              OPERATIVE REPORT   PREOPERATIVE DIAGNOSIS:  Large facial cyst, 4 cm right cheek cyst consistent with sebaceous cyst, and left 2 cm and 1 cm facial cyst just below the left infraorbital region.  POSTOPERATIVE DIAGNOSIS:  Large facial cyst, 4 cm right cheek cyst consistent with sebaceous cyst, and left 2 cm and 1 cm facial cyst just below the left infraorbital region.  OPERATION:  Excision of 2 left facial cyst and 1 large right facial cyst.  ANESTHESIA:  Local, Xylocaine with epinephrine, 10 mL.  COMPLICATIONS:  None.  BRIEF CLINICAL NOTE:  Jonathon Chaney is a 47 year old gentleman who has had a very large right facial cyst for about 3-4 years that measures about 3 x 4 cm in size.  It is soft and consistent with a large sebaceous cyst.  He has had a previous drainage of a right eyebrow cyst as well as drainage of the left facial cyst a couple of times previously.  He has recurrence of left facial cyst just below the left eye as well as some hard scarred in cyst just above the larger cyst on the left side.  He is taken to operating room at this time for excision of multiple facial cysts under local anesthesia.  DESCRIPTION OF PROCEDURE:  The patient was brought to the operating room, the right face was prepped first with Betadine.  The area of the large cyst which was just superior to the edge of the mandible was injected with Xylocaine with epinephrine.  Dissection was then carried down over the cyst and a smooth encapsulated sebaceous cyst was encountered.  This was carefully dissected out.  Hemostasis was obtained with cautery.  The cyst was removed and sent to Pathology.  The  defect was closed with 4-0 chromic sutures subcutaneously, reapproximated subcutaneous tissue and skin edges and the skin edges were closed with Dermabond.  Next, the left infraorbital cyst were removed, some of the scar tissue was elliptically incised and then a firm hard cyst was encountered superiorly and a larger about 2-3 cm cyst was encountered inferiorly.  These cysts were removed.  Hemostasis was obtained with cautery and the defect was closed with running 4-0 chromic sutures subcutaneously and subcuticular stitch with 4-0 chromic, and then Dermabond was used to reapproximate skin edges.  The superficially. Klein tolerated this well.  DISPOSITION:  He was subsequently discharged home later this morning on Keflex 500 mg t.i.d. for 5 days.  We will have him followup in my office in 1 week for recheck.  He is instructed to take Tylenol or Motrin p.r.n. pain.          ______________________________ Kristine Garbe. Ezzard Standing, M.D.     CEN/MEDQ  D:  07/25/2012  T:  07/25/2012  Job:  [id number]

## 2012-09-21 ENCOUNTER — Emergency Department (HOSPITAL_COMMUNITY)
Admission: EM | Admit: 2012-09-21 | Discharge: 2012-09-21 | Disposition: A | Discharge status: Home / Self Care | Payer: PRIVATE HEALTH INSURANCE | Attending: Emergency Medicine | Admitting: Emergency Medicine

## 2012-09-21 ENCOUNTER — Encounter (HOSPITAL_COMMUNITY): Payer: Self-pay | Admitting: *Deleted

## 2012-09-21 DIAGNOSIS — I10 Essential (primary) hypertension: Secondary | ICD-10-CM | POA: Insufficient documentation

## 2012-09-21 DIAGNOSIS — Z872 Personal history of diseases of the skin and subcutaneous tissue: Secondary | ICD-10-CM | POA: Insufficient documentation

## 2012-09-21 DIAGNOSIS — Z87891 Personal history of nicotine dependence: Secondary | ICD-10-CM | POA: Insufficient documentation

## 2012-09-21 DIAGNOSIS — R040 Epistaxis: Secondary | ICD-10-CM | POA: Insufficient documentation

## 2012-09-21 LAB — POCT I-STAT, CHEM 8
Chloride: 107 mEq/L (ref 96–112)
HCT: 44 % (ref 39.0–52.0)
Hemoglobin: 15 g/dL (ref 13.0–17.0)
Potassium: 4 mEq/L (ref 3.5–5.1)

## 2012-09-21 MED ORDER — OXYMETAZOLINE HCL 0.05 % NA SOLN
1.0000 | Freq: Once | NASAL | Status: AC
  Administered 2012-09-21: 1 via NASAL
  Filled 2012-09-21: qty 15

## 2012-09-21 NOTE — ED Notes (Signed)
Pt is here with intermittent nose bleeds over the last three days and states that his blood pressure has been elevated to 165/110.  Pt is not on BP medication.  No chest pain, sob or headaches.  Pt reports blurry vision intermittent for a couple of years.  No blurry vision now

## 2012-09-21 NOTE — ED Notes (Signed)
Pt's nose has started bleeding again.  Pt is holding pressure with a cloth.  Pt also reports he has a HA.

## 2012-09-21 NOTE — ED Provider Notes (Signed)
History     CSN: [id number]  Arrival date & time 09/21/12  0705   First MD Initiated Contact with Patient 09/21/12 0919      Chief Complaint  Patient presents with  . Epistaxis    (Consider location/radiation/quality/duration/timing/severity/associated sxs/prior treatment) HPI  Patient reports she has started having nosebleeds from his right nostril for the past 5-7 days. His sister states she has been checking his blood pressure and it has been as high as 160/95. He states sometimes he wakes up with a nosebleed and it starts when he bends over. He denies sneezing, cough, rhinorrhea, fever, or headache. He has not had nosebleeds since he was a child and broke his nose. He has no family doctor. He states he's never had hypertension before. Sister states she has hypertension and it runs in her family.  PCP none  Past Medical History  Diagnosis Date  . Cyst of skin     Past Surgical History  Procedure Laterality Date  . Cyst excision    . Mass excision Bilateral 07/25/2012    Procedure: FACIAL CYST X3 WITH CLOSURE;  Surgeon: Drema Halon, MD;  Location: [facility];  Service: ENT;  Laterality: Bilateral;    No family history on file.  History  Substance Use Topics  . Smoking status: Former Games developer  . Smokeless tobacco: Not on file  . Alcohol Use: Yes     Comment: mod   employed   Review of Systems  All other systems reviewed and are negative.    Allergies  Review of patient's allergies indicates no known allergies.  Home Medications  No current outpatient prescriptions on file.  BP 155/104  Pulse 62  Temp(Src) 98.3 F (36.8 C) (Oral)  Resp 16  SpO2 100% Vital signs normal except for diastolic hypertension   Physical Exam  Nursing note and vitals reviewed. Constitutional: He is oriented to person, place, and time. He appears well-developed and well-nourished.  Non-toxic appearance. He does not appear ill. No distress.  HENT:  Head:  Normocephalic and atraumatic.  Right Ear: External ear normal.  Left Ear: External ear normal.  Nose: Nose normal. No mucosal edema or rhinorrhea.  Mouth/Throat: Oropharynx is clear and moist and mucous membranes are normal. No dental abscesses or edematous.  Patient's nares were inspected. There were no obvious bleeding sites on his right anterior septum. When I look at his left septum there is a small area of hyperemia however he is not bleeding from that site.  Eyes: Conjunctivae and EOM are normal. Pupils are equal, round, and reactive to light.  Neck: Normal range of motion and full passive range of motion without pain. Neck supple.  Cardiovascular: Normal rate, regular rhythm and normal heart sounds.  Exam reveals no gallop and no friction rub.   No murmur heard. Pulmonary/Chest: Effort normal and breath sounds normal. No respiratory distress. He has no rhonchi. He exhibits no crepitus.  Abdominal: Normal appearance.  Musculoskeletal: Normal range of motion. He exhibits no edema and no tenderness.  Moves all extremities well.   Neurological: He is alert and oriented to person, place, and time. He has normal strength. No cranial nerve deficit.  Skin: Skin is warm, dry and intact. No rash noted. No erythema. No pallor.  Psychiatric: He has a normal mood and affect. His speech is normal and behavior is normal. His mood appears not anxious.    ED Course  Procedures (including critical care time) Medications  oxymetazoline (AFRIN) 0.05 % nasal  spray 1 spray (1 spray Each Nare Given 09/21/12 1131)   Patient was seen in the ED in November 2013 at that time his blood pressure was 135/85.  Pt has minor bleeding while waiting to be discharged, he was started on afrin spray.   Results for orders placed during the hospital encounter of 09/21/12  POCT I-STAT, CHEM 8      Result Value Range   Sodium 142  135 - 145 mEq/L   Potassium 4.0  3.5 - 5.1 mEq/L   Chloride 107  96 - 112 mEq/L   BUN 9   6 - 23 mg/dL   Creatinine, Ser 1.61  0.50 - 1.35 mg/dL   Glucose, Bld 99  70 - 99 mg/dL   Calcium, Ion 0.96  1.12 - 1.23 mmol/L   TCO2 27  0 - 100 mmol/L   Hemoglobin 15.0  13.0 - 17.0 g/dL   HCT 04.5  40.9 - 81.1 %   Laboratory interpretation all normal      1. Epistaxis   2. Hypertension    Plan discharge  Devoria Albe, MD, FACEP    MDM          Ward Givens, MD 09/21/12 [phone]

## 2012-09-23 ENCOUNTER — Emergency Department (HOSPITAL_COMMUNITY)
Admission: EM | Admit: 2012-09-23 | Discharge: 2012-09-23 | Disposition: A | Discharge status: Home / Self Care | Payer: PRIVATE HEALTH INSURANCE | Attending: Emergency Medicine | Admitting: Emergency Medicine

## 2012-09-23 ENCOUNTER — Encounter (HOSPITAL_COMMUNITY): Payer: Self-pay | Admitting: *Deleted

## 2012-09-23 DIAGNOSIS — R040 Epistaxis: Secondary | ICD-10-CM

## 2012-09-23 DIAGNOSIS — R42 Dizziness and giddiness: Secondary | ICD-10-CM | POA: Insufficient documentation

## 2012-09-23 DIAGNOSIS — Z872 Personal history of diseases of the skin and subcutaneous tissue: Secondary | ICD-10-CM | POA: Insufficient documentation

## 2012-09-23 DIAGNOSIS — IMO0002 Reserved for concepts with insufficient information to code with codable children: Secondary | ICD-10-CM | POA: Insufficient documentation

## 2012-09-23 DIAGNOSIS — Z87891 Personal history of nicotine dependence: Secondary | ICD-10-CM | POA: Insufficient documentation

## 2012-09-23 MED ORDER — SODIUM CHLORIDE 0.65 % NA SOLN: 1.0000 | NASAL | Status: DC | PRN

## 2012-09-23 NOTE — ED Provider Notes (Signed)
History   CSN: [id number] Arrival date & time 09/23/12  1619 First MD Initiated Contact with Patient 09/23/12 1755      Chief Complaint  Patient presents with  . Epistaxis  . Dizziness    HPI Patient presents to the emergency room with complaints of intermittent nosebleeds since Mother's Day.  Nose has been bleeding intermittently for several minutes at a time. Patient was previously seen in the emergency department and was prescribed Afrin. Unfortunately, his nosebleed has not resolved.  Patient had 2 more episodes today so he came to the emergency room to be evaluated. The nose bleeding has spontaneously resolved. He's not having any other difficulty or bleeding elsewhere. Patient also is concerned about his blood pressure being elevated. She does not have history of hypertension but there is a family history of hypertension.  The patient does not have a primary doctor right now. He is to go to health service. He also used to see and ENT doctor but has not made an appointment because of insurance reasons. Past Medical History  Diagnosis Date  . Cyst of skin     Past Surgical History  Procedure Laterality Date  . Cyst excision    . Mass excision Bilateral 07/25/2012    Procedure: FACIAL CYST X3 WITH CLOSURE;  Surgeon: Drema Halon, MD;  Location: [facility];  Service: ENT;  Laterality: Bilateral;    History reviewed. No pertinent family history.  History  Substance Use Topics  . Smoking status: Former Games developer  . Smokeless tobacco: Not on file  . Alcohol Use: Yes     Comment: mod      Review of Systems  All other systems reviewed and are negative.    Allergies  Review of patient's allergies indicates no known allergies.  Home Medications   Current Outpatient Rx  Name  Route  Sig  Dispense  Refill  . fluticasone (FLONASE) 50 MCG/ACT nasal spray   Nasal   Place 1 spray into the nose daily.         . sodium chloride (OCEAN) 0.65 % nasal spray  Nasal   Place 1 spray into the nose as needed for congestion.   15 mL   12     BP 126/84  Pulse 75  Temp(Src) 98.9 F (37.2 C) (Oral)  Resp 18  SpO2 98%  Physical Exam  Nursing note and vitals reviewed. Constitutional: He appears well-developed and well-nourished. No distress.  HENT:  Head: Normocephalic and atraumatic.  Right Ear: External ear normal.  Left Ear: External ear normal.  Small amount of residual blood right nares, no active bleeding, no visible vessel  Eyes: Conjunctivae are normal. Right eye exhibits no discharge. Left eye exhibits no discharge. No scleral icterus.  Neck: Neck supple. No tracheal deviation present.  Cardiovascular: Normal rate, regular rhythm and intact distal pulses.   Pulmonary/Chest: Effort normal and breath sounds normal. No stridor. No respiratory distress. He has no wheezes. He has no rales.  Abdominal: Soft. Bowel sounds are normal. He exhibits no distension. There is no tenderness. There is no rebound and no guarding.  Musculoskeletal: He exhibits no edema and no tenderness.  Neurological: He is alert. He has normal strength. No sensory deficit. Cranial nerve deficit:  no gross defecits noted. He exhibits normal muscle tone. He displays no seizure activity. Coordination normal.  Skin: Skin is warm and dry. No rash noted.  Psychiatric: He has a normal mood and affect.    ED Course  Procedures (  including critical care time)  Labs Reviewed - No data to display No results found.   1. Epistaxis, recurrent       MDM  The patient is not hypertensive here in the emergency department. Regarding his nosebleeds, he is not having any active bleeding. We'll have him discontinue the Afrin. I will have him take saline nasal spray. I recommended he follow up with an ear nose and throat Dr. for further evaluation. I explained to him at this time he doesn't require packing fortunately.  Followup with an ENT physician be the most appropriate  treatment at this time  I reviewed his previous visit. he did have an i-STAT that showed a normal hematocrit.       Celene Kras, MD 09/23/12 Rickey Primus

## 2012-09-23 NOTE — ED Notes (Signed)
Pt reports experiencing nosebleeds since "mothers day." also states "my blood pressure has been up." pt not currently bleeding. Pt has had 2 nosebleeds today.

## 2012-10-02 ENCOUNTER — Ambulatory Visit: Payer: PRIVATE HEALTH INSURANCE

## 2015-08-27 ENCOUNTER — Encounter (HOSPITAL_COMMUNITY): Payer: Self-pay | Admitting: Emergency Medicine

## 2015-08-27 ENCOUNTER — Emergency Department (HOSPITAL_COMMUNITY)
Admission: EM | Admit: 2015-08-27 | Discharge: 2015-08-27 | Disposition: A | Discharge status: Home / Self Care | Payer: No Typology Code available for payment source | Attending: Emergency Medicine | Admitting: Emergency Medicine

## 2015-08-27 DIAGNOSIS — Z87891 Personal history of nicotine dependence: Secondary | ICD-10-CM | POA: Insufficient documentation

## 2015-08-27 DIAGNOSIS — Z23 Encounter for immunization: Secondary | ICD-10-CM | POA: Insufficient documentation

## 2015-08-27 DIAGNOSIS — L0291 Cutaneous abscess, unspecified: Secondary | ICD-10-CM

## 2015-08-27 DIAGNOSIS — Z792 Long term (current) use of antibiotics: Secondary | ICD-10-CM | POA: Diagnosis not present

## 2015-08-27 DIAGNOSIS — I10 Essential (primary) hypertension: Secondary | ICD-10-CM | POA: Diagnosis not present

## 2015-08-27 DIAGNOSIS — L0201 Cutaneous abscess of face: Secondary | ICD-10-CM | POA: Insufficient documentation

## 2015-08-27 HISTORY — DX: Essential (primary) hypertension: I10

## 2015-08-27 MED ORDER — TRAMADOL HCL 50 MG PO TABS: 50.0000 mg | ORAL_TABLET | Freq: Four times a day (QID) | ORAL | Status: AC | PRN

## 2015-08-27 MED ORDER — TETANUS-DIPHTH-ACELL PERTUSSIS 5-2.5-18.5 LF-MCG/0.5 IM SUSP
0.5000 mL | Freq: Once | INTRAMUSCULAR | Status: AC
  Administered 2015-08-27: 0.5 mL via INTRAMUSCULAR
  Filled 2015-08-27: qty 0.5

## 2015-08-27 MED ORDER — CEPHALEXIN 500 MG PO CAPS: 500.0000 mg | ORAL_CAPSULE | Freq: Four times a day (QID) | ORAL | Status: AC

## 2015-08-27 MED ORDER — BUPIVACAINE HCL (PF) 0.5 % IJ SOLN
10.0000 mL | Freq: Once | INTRAMUSCULAR | Status: AC
  Administered 2015-08-27: 3 mL
  Filled 2015-08-27: qty 10

## 2015-08-27 NOTE — Discharge Instructions (Signed)
You have been seen today for an abscess. The abscess was drained here in the ED. Refer to the attached literature for aftercare. Continue to take the Bactrim, as prescribed. Additionally, begin taking the Keflex today. Please take all of your antibiotics until finished!   You may develop abdominal discomfort or diarrhea from the antibiotic.  You may help offset this with probiotics which you can buy or get in yogurt. Do not eat or take the probiotics until 2 hours after your antibiotic. Follow up with ENT for reevaluation and chronic management. Follow up with PCP as needed. Return to ED should symptoms worsen.

## 2015-08-27 NOTE — ED Notes (Signed)
Pt's drained abcess was dressed with 2 x 2 and tape and is clean dry and intact

## 2015-08-27 NOTE — ED Notes (Signed)
Pt has large cyst on right side of face, has had similar ones lanced in past per surgery

## 2015-08-27 NOTE — ED Provider Notes (Signed)
CSN: [id number]     Arrival date & time 08/27/15  1012 History   First MD Initiated Contact with Patient 08/27/15 1215     Chief Complaint  Patient presents with  . Abscess     (Consider location/radiation/quality/duration/timing/severity/associated sxs/prior Treatment) HPI   Jonathon Chaney is a 50 y.o. male, with a history of Cysts and hypertension, presenting to the ED with A large cyst on the right lower face. Patient states that the cyst has been present for a number of years, but within the last 3-4 days it has increased from about 1/2 inch to its current size. Patient also states that it was not painful previously, but is now exquisitely tender. Patient currently rates his pain at 10 out of 10, throbbing, nonradiating. Patient denies fever/chills, nausea/vomiting, difficulty swallowing or breathing, or any other complaints. Pt adds he is currently halfway through a two week course of Bactrim, called into the pharmacy by his PCP.     Past Medical History  Diagnosis Date  . Cyst of skin   . Hypertension    Past Surgical History  Procedure Laterality Date  . Cyst excision    . Mass excision Bilateral 07/25/2012    Procedure: FACIAL CYST X3 WITH CLOSURE;  Surgeon: Drema Halon, MD;  Location: [facility];  Service: ENT;  Laterality: Bilateral;   No family history on file. Social History  Substance Use Topics  . Smoking status: Former Games developer  . Smokeless tobacco: None  . Alcohol Use: Yes     Comment: socially    Review of Systems  Constitutional: Negative for fever and chills.  HENT: Positive for facial swelling (facial abscess).   Gastrointestinal: Negative for nausea and vomiting.      Allergies  Review of patient's allergies indicates no known allergies.  Home Medications   Prior to Admission medications   Medication Sig Start Date End Date Taking? Authorizing Provider  hydrochlorothiazide (HYDRODIURIL) 25 MG tablet Take 25 mg by mouth every  morning. 08/10/15  Yes Historical Provider, MD  sulfamethoxazole-trimethoprim (BACTRIM DS,SEPTRA DS) 800-160 MG tablet Take 1 tablet by mouth 2 (two) times daily. 08/15/15  Yes Historical Provider, MD  cephALEXin (KEFLEX) 500 MG capsule Take 1 capsule (500 mg total) by mouth 4 (four) times daily. 08/27/15   Shawn C Joy, PA-C  traMADol (ULTRAM) 50 MG tablet Take 1 tablet (50 mg total) by mouth every 6 (six) hours as needed. 08/27/15   Shawn C Joy, PA-C   BP 130/78 mmHg  Pulse 66  Temp(Src) 97.8 F (36.6 C) (Oral)  Resp 18  Ht  (1.753 m)  Wt 70.761 kg  BMI 23.03 kg/m2  SpO2 99% Physical Exam  Constitutional: He appears well-developed and well-nourished. No distress.  HENT:  Head: Normocephalic and atraumatic.  Mouth/Throat: Oropharynx is clear and moist.  4 cm x 4 cm fluctuant mass on the patient's right mandible. No drainage. Overlying erythema evident. Exquisitely tender.  Eyes: Conjunctivae are normal. Pupils are equal, round, and reactive to light.  Neck: Normal range of motion. Neck supple.  Cardiovascular: Normal rate, regular rhythm, normal heart sounds and intact distal pulses.   Pulmonary/Chest: Effort normal and breath sounds normal. No respiratory distress.  Abdominal: Soft. There is no tenderness. There is no guarding.  Musculoskeletal: He exhibits no edema or tenderness.  Lymphadenopathy:    He has no cervical adenopathy.  Neurological: He is alert.  Skin: Skin is warm and dry. He is not diaphoretic.  Psychiatric: He  has a normal mood and affect. His behavior is normal.  Nursing note and vitals reviewed.   ED Course  .Marland Kitchen.Incision and Drainage Date/Time: 08/27/2015 1:50 PM Performed by: Anselm PancoastJOY, SHAWN C Authorized by: Harolyn RutherfordJOY, SHAWN C Consent: Verbal consent obtained. Risks and benefits: risks, benefits and alternatives were discussed Consent given by: patient Patient understanding: patient states understanding of the procedure being performed Patient consent: the patient's  understanding of the procedure matches consent given Procedure consent: procedure consent matches procedure scheduled Patient identity confirmed: verbally with patient and arm band Type: abscess Body area: head Location details: face Anesthesia: local infiltration Local anesthetic: bupivacaine 0.5% without epinephrine Anesthetic total: 2 ml Patient sedated: no Scalpel size: 11 Needle gauge: 18 Incision type: single straight Incision depth: subcutaneous Complexity: simple Drainage: purulent Drainage amount: copious Wound treatment: wound left open Patient tolerance: Patient tolerated the procedure well with no immediate complications   (including critical care time)  EMERGENCY DEPARTMENT US SOFT TISSUE INTERPRETATION "Study: Limited Ultrasound of the noted body part in comments below"  INDICATIONS: Soft tissue infection Multiple views of the body part are obtained with a multi-frequency linear probe  PERFORMED BY:  Myself  IMAGES ARCHIVED?: Yes  SIDE:Right   BODY PART:Other soft tisse (comment in note)  FINDINGS: Abcess present and Cellulitis absent  LIMITATIONS:  None  INTERPRETATION:  Abcess present and No cellulitis noted  COMMENT:  Ultrasound of a facial abscess    MDM   Final diagnoses:  Abscess    [name] presents with a facial abscess that began 3-4 days ago.  Findings and plan of care discussed with Tilden FossaElizabeth Rees, MD. Dr. Madilyn Hookees personally evaluated and examined this patient.  Suspect that the patient has a sebaceous cyst that has become infected. Patient has no evidence of systemic infection or sepsis. Patient tolerated I&D well. Patient advised to begin taking Keflex today and to continue taking his Bactrim as prescribed. Patient follow-up with ENT for reassessment and chronic management. Aftercare and return precautions discussed. Patient voiced understanding of these instructions and is comfortable with discharge.  Filed Vitals:   08/27/15  1020 08/27/15 1100 08/27/15 1249 08/27/15 1403  BP: 139/92 123/75 130/78 136/92  Pulse: 83 79 66 70  Temp: 97.8 F (36.6 C)     TempSrc: Oral     Resp: 20  18 16   Height: 5\' 9"  (1.753 m)     Weight: 70.761 kg     SpO2: 99% 99% 99% 99%     Anselm PancoastShawn C Joy, PA-C 08/27/15 1407  Tilden FossaElizabeth Rees, MD 08/28/15 [phone]

## 2019-01-26 DIAGNOSIS — Z125 Encounter for screening for malignant neoplasm of prostate: Secondary | ICD-10-CM | POA: Diagnosis not present

## 2019-01-26 DIAGNOSIS — I10 Essential (primary) hypertension: Secondary | ICD-10-CM | POA: Diagnosis not present

## 2019-01-26 DIAGNOSIS — Z0000 Encounter for general adult medical examination without abnormal findings: Secondary | ICD-10-CM | POA: Diagnosis not present

## 2019-01-26 DIAGNOSIS — Z23 Encounter for immunization: Secondary | ICD-10-CM | POA: Diagnosis not present

## 2019-01-26 DIAGNOSIS — Z13220 Encounter for screening for lipoid disorders: Secondary | ICD-10-CM | POA: Diagnosis not present

## 2019-07-06 DIAGNOSIS — M25511 Pain in right shoulder: Secondary | ICD-10-CM | POA: Diagnosis not present

## 2019-07-15 DIAGNOSIS — M67911 Unspecified disorder of synovium and tendon, right shoulder: Secondary | ICD-10-CM | POA: Diagnosis not present

## 2019-07-15 DIAGNOSIS — M67912 Unspecified disorder of synovium and tendon, left shoulder: Secondary | ICD-10-CM | POA: Diagnosis not present

## 2019-07-17 DIAGNOSIS — M7542 Impingement syndrome of left shoulder: Secondary | ICD-10-CM | POA: Diagnosis not present

## 2019-07-17 DIAGNOSIS — M7541 Impingement syndrome of right shoulder: Secondary | ICD-10-CM | POA: Diagnosis not present

## 2019-07-31 DIAGNOSIS — M25512 Pain in left shoulder: Secondary | ICD-10-CM | POA: Diagnosis not present

## 2019-08-12 DIAGNOSIS — M67912 Unspecified disorder of synovium and tendon, left shoulder: Secondary | ICD-10-CM | POA: Diagnosis not present

## 2019-08-12 DIAGNOSIS — M67911 Unspecified disorder of synovium and tendon, right shoulder: Secondary | ICD-10-CM | POA: Diagnosis not present

## 2020-05-21 DIAGNOSIS — Z1152 Encounter for screening for COVID-19: Secondary | ICD-10-CM | POA: Diagnosis not present

## 2021-02-03 DIAGNOSIS — Z125 Encounter for screening for malignant neoplasm of prostate: Secondary | ICD-10-CM | POA: Diagnosis not present

## 2021-02-03 DIAGNOSIS — Z23 Encounter for immunization: Secondary | ICD-10-CM | POA: Diagnosis not present

## 2021-02-03 DIAGNOSIS — Z0000 Encounter for general adult medical examination without abnormal findings: Secondary | ICD-10-CM | POA: Diagnosis not present

## 2021-02-03 DIAGNOSIS — Z13220 Encounter for screening for lipoid disorders: Secondary | ICD-10-CM | POA: Diagnosis not present

## 2023-07-22 ENCOUNTER — Other Ambulatory Visit: Payer: Self-pay

## 2023-07-22 ENCOUNTER — Encounter (HOSPITAL_BASED_OUTPATIENT_CLINIC_OR_DEPARTMENT_OTHER): Payer: Self-pay | Admitting: Emergency Medicine

## 2023-07-22 ENCOUNTER — Emergency Department (HOSPITAL_BASED_OUTPATIENT_CLINIC_OR_DEPARTMENT_OTHER): Admitting: Radiology

## 2023-07-22 ENCOUNTER — Emergency Department (HOSPITAL_BASED_OUTPATIENT_CLINIC_OR_DEPARTMENT_OTHER)

## 2023-07-22 ENCOUNTER — Emergency Department (HOSPITAL_BASED_OUTPATIENT_CLINIC_OR_DEPARTMENT_OTHER)
Admission: EM | Admit: 2023-07-22 | Discharge: 2023-07-22 | Disposition: A | Discharge status: Home / Self Care | Attending: Emergency Medicine | Admitting: Emergency Medicine

## 2023-07-22 DIAGNOSIS — R072 Precordial pain: Secondary | ICD-10-CM | POA: Diagnosis not present

## 2023-07-22 DIAGNOSIS — R079 Chest pain, unspecified: Secondary | ICD-10-CM | POA: Diagnosis present

## 2023-07-22 DIAGNOSIS — R519 Headache, unspecified: Secondary | ICD-10-CM | POA: Diagnosis not present

## 2023-07-22 LAB — CBC
HCT: 39.6 % (ref 39.0–52.0)
Hemoglobin: 13.9 g/dL (ref 13.0–17.0)
MCH: 30.2 pg (ref 26.0–34.0)
MCHC: 35.1 g/dL (ref 30.0–36.0)
MCV: 85.9 fL (ref 80.0–100.0)
Platelets: 478 10*3/uL — ABNORMAL HIGH (ref 150–400)
RBC: 4.61 MIL/uL (ref 4.22–5.81)
RDW: 13.6 % (ref 11.5–15.5)
WBC: 6.4 10*3/uL (ref 4.0–10.5)
nRBC: 0 % (ref 0.0–0.2)

## 2023-07-22 LAB — BASIC METABOLIC PANEL
Anion gap: 9 (ref 5–15)
BUN: 6 mg/dL (ref 6–20)
CO2: 24 mmol/L (ref 22–32)
Calcium: 9.9 mg/dL (ref 8.9–10.3)
Chloride: 103 mmol/L (ref 98–111)
Creatinine, Ser: 1.03 mg/dL (ref 0.61–1.24)
GFR, Estimated: >60 mL/min (ref >60)
Glucose, Bld: 93 mg/dL (ref 70–99)
Potassium: 3.3 mmol/L — ABNORMAL LOW (ref 3.5–5.1)
Sodium: 136 mmol/L (ref 135–145)

## 2023-07-22 LAB — TROPONIN I (HIGH SENSITIVITY)
Troponin I (High Sensitivity): 2 ng/L (ref <18)
Troponin I (High Sensitivity): 3 ng/L (ref <18)

## 2023-07-22 LAB — RESP PANEL BY RT-PCR (RSV, FLU A&B, COVID)  RVPGX2
Influenza A by PCR: NEGATIVE
Influenza B by PCR: NEGATIVE
Resp Syncytial Virus by PCR: NEGATIVE
SARS Coronavirus 2 by RT PCR: NEGATIVE

## 2023-07-22 LAB — D-DIMER, QUANTITATIVE: D-Dimer, Quant: 0.65 ug{FEU}/mL — ABNORMAL HIGH (ref 0.00–0.50)

## 2023-07-22 MED ORDER — METOCLOPRAMIDE HCL 5 MG/ML IJ SOLN
10.0000 mg | Freq: Once | INTRAMUSCULAR | Status: AC
  Administered 2023-07-22: 10 mg via INTRAVENOUS
  Filled 2023-07-22: qty 2

## 2023-07-22 NOTE — ED Provider Notes (Signed)
 [place] EM[facility]ENC[facility] DEPARTMENT AT [facility] Provider Note   CSN: [id number] Arrival date & time: 07/22/23  1546     History  Chief Complaint  Patient presents with   Chest Pain    Jonathon Chaney is a 58 y.o. male.  HPI     58 year old male comes in with chief complaint of chest pain, headache. Patient states that he has had chest tightness for the last 2 or 3 days.  Symptoms are worse when he has cough.  Patient also has pain between his shoulder blades.  Symptoms are not typically provoked with exertion, but patient has experienced some shortness of breath.  Patient has no URI-like symptoms.  No sore throat.  Pt has no hx of PE, DVT and denies any exogenous hormone (testosterone / estrogen) use, long distance travels or surgery in the past 6 weeks, active cancer, recent immobilization.  Patient has no known history of coronary artery disease.  He denies any recent cardiac workup.  Patient also indicates that he is experience some headaches for the last few days.  Headaches are bilateral temporal, dull, fairly constant with no specific evoking, aggravating or relieving factors.  Specifically there is no photophobia.  Occasionally, headache will get worse when he is leaning forward.  Home Medications Prior to Admission medications   Medication Sig Start Date End Date Taking? Authorizing Provider  cephALEXin (KEFLEX) 500 MG capsule Take 1 capsule (500 mg total) by mouth 4 (four) times daily. 08/27/15   Joy, Shawn C, PA-C  hydrochlorothiazide (HYDRODIURIL) 25 MG tablet Take 25 mg by mouth every morning. 08/10/15   [provider]  sulfamethoxazole-trimethoprim (BACTRIM DS,SEPTRA DS) 800-160 MG tablet Take 1 tablet by mouth 2 (two) times daily. 08/15/15   [provider]  traMADol (ULTRAM) 50 MG tablet Take 1 tablet (50 mg total) by mouth every 6 (six) hours as needed. 08/27/15   Joy, Shawn C, PA-C  sodium chloride (OCEAN) 0.65 % nasal spray Place 1 spray  into the nose as needed for congestion. 09/23/12 08/27/15  Linwood Dibbles, MD      Allergies    Patient has no known allergies.    Review of Systems   Review of Systems  All other systems reviewed and are negative.   Physical Exam Updated Vital Signs BP (!) 132/94   Pulse 74   Temp 98.2 F (36.8 C)   Resp 16   Wt 76.2 kg   SpO2 99%   BMI 24.81 kg/m  Physical Exam Vitals and nursing note reviewed.  Constitutional:      Appearance: He is well-developed.  HENT:     Head: Atraumatic.  Eyes:     Extraocular Movements: Extraocular movements intact.     Pupils: Pupils are equal, round, and reactive to light.  Cardiovascular:     Rate and Rhythm: Normal rate.     Heart sounds: Normal heart sounds.  Pulmonary:     Effort: Pulmonary effort is normal.     Breath sounds: Normal breath sounds. No decreased breath sounds, wheezing, rhonchi or rales.  Musculoskeletal:     Cervical back: Neck supple.  Skin:    General: Skin is warm.  Neurological:     General: No focal deficit present.     Mental Status: He is alert and oriented to person, place, and time.     Cranial Nerves: No cranial nerve deficit.     Motor: No weakness.     ED Results / Procedures / Treatments  Labs (all labs ordered are listed, but only abnormal results are displayed) Labs Reviewed  BASIC METABOLIC PANEL - Abnormal; Notable for the following components:      Result Value   Potassium 3.3 (*)    All other components within normal limits  CBC - Abnormal; Notable for the following components:   Platelets 478 (*)    All other components within normal limits  D-DIMER, QUANTITATIVE - Abnormal; Notable for the following components:   D-Dimer, Quant 0.65 (*)    All other components within normal limits  RESP PANEL BY RT-PCR (RSV, FLU A&B, COVID)  [id number]  TROPONIN I (HIGH SENSITIVITY)  TROPONIN I (HIGH SENSITIVITY)    EKG EKG Interpretation Date/Time:  Monday July 22 2023 15:59:45 EDT Ventricular Rate:   105 PR Interval:  132 QRS Duration:  92 QT Interval:  364 QTC Calculation: 481 R Axis:   80  Text Interpretation: Sinus tachycardia Inferior infarct , age undetermined Abnormal ECG No previous ECGs available No acute changes Confirmed by Derwood Kaplan [phone]) on 07/22/2023 7:26:58 PM  Radiology CT Head Wo Contrast Result Date: 07/22/2023 CLINICAL DATA:  Headache, new onset (Age >= 51y) EXAM: CT HEAD WITHOUT CONTRAST TECHNIQUE: Contiguous axial images were obtained from the base of the skull through the vertex without intravenous contrast. RADIATION DOSE REDUCTION: This exam was performed according to the departmental dose-optimization program which includes automated exposure control, adjustment of the mA and/or kV according to patient size and/or use of iterative reconstruction technique. COMPARISON:  None Available. FINDINGS: Brain: No intracranial hemorrhage, mass effect, or midline shift. No hydrocephalus. The basilar cisterns are patent. No evidence of territorial infarct or acute ischemia. No extra-axial or intracranial fluid collection. Vascular: No hyperdense vessel or unexpected calcification. Skull: No fracture or focal lesion. Sinuses/Orbits: Scattered mucosal thickening of ethmoid air cells and left maxillary sinus. No mastoid effusion. Other: None. IMPRESSION: 1. No acute intracranial abnormality. 2. Scattered mucosal thickening of ethmoid air cells and left maxillary sinus. Electronically Signed   By: Narda Rutherford M.D.   On: 07/22/2023 22:25   DG Chest 2 View Result Date: 07/22/2023 CLINICAL DATA:  Chest pain. EXAM: CHEST - 2 VIEW COMPARISON:  None Available. FINDINGS: No focal consolidation, pleural effusion, or pneumothorax. The cardiac silhouette is within normal limits. No acute osseous pathology. IMPRESSION: No active cardiopulmonary disease. Electronically Signed   By: Elgie Collard M.D.   On: 07/22/2023 19:11    Procedures Procedures    Medications Ordered in  ED Medications  metoCLOPramide (REGLAN) injection 10 mg (10 mg Intravenous Given 07/22/23 2038)    ED Course/ Medical Decision Making/ A&P                                 Medical Decision Making Amount and/or Complexity of Data Reviewed Labs: ordered. Radiology: ordered.  Risk Prescription drug management.   This patient presents to the ED with chief complaint(s) of chest pain, back pain, shortness of breath and headache that are been present over the 2 days with pertinent past medical history of hypertension.The complaint involves an extensive differential diagnosis and also carries with it a high risk of complications and morbidity.    The differential diagnosis considered for this patient includes  ACS syndrome Aortic dissection CHF exacerbation Valvular disorder Myocarditis Pericarditis Endocarditis Pericardial effusion / tamponade Pneumonia Pleural effusion / Pulmonary edema PE Pneumothorax Musculoskeletal pain PUD / Gastritis / Esophagitis Esophageal spasm  Since  patient is 80/50 and has new onset headache, CT scan of the brain also ordered.  The initial plan is to get basic labs.  Given the pleuritic component to the symptoms, we will get D-dimer.  Will also get flu, COVID and RSV test.  X-ray of the chest also ordered.  Delta troponin ordered.   Additional history obtained: Records reviewed Primary Care Documents and patient's medications  Independent labs interpretation:  The following labs were independently interpreted: Troponin x 2 is reassuring. D-dimer is slightly elevated, but it is normal/negative based on age-adjusted D-dimer rules.  Independent visualization and interpretation of imaging: - I independently visualized the following imaging with scope of interpretation limited to determining acute life threatening conditions related to emergency care: X-ray of the chest, which revealed no of pneumonia.  Treatment and Reassessment: Patient's hear  score is 3. Delta troponins are negative. Clinically, symptoms are not consistent with ACS at this time.  Will advise patient follows up with PCP.  EKG showed nonspecific T wave inversion in lead II only.  Stable for discharge with return precautions and PCP follow-up recommendation.  CT scan of the head was ordered, it shows no evidence of brain bleed.  Final Clinical Impression(s) / ED Diagnoses Final diagnoses:  Precordial chest pain  Acute nonintractable headache, unspecified headache type    Rx / DC Orders ED Discharge Orders     None         Derwood Kaplan, MD 07/22/23 2317

## 2023-07-22 NOTE — Discharge Instructions (Addendum)
 We saw you in the ER for headache, chest pain. All the results in the ER are normal, labs and imaging.  Heart enzymes are reassuring.  EKG is reassuring.  Screen for blood clot is also normal.  CT scan of the brain does not show any evidence of brain bleed.  We are not sure what is causing your symptoms. The workup in the ER is not complete, and is limited to screening for life threatening and emergent conditions only, so please see a primary care doctor for further evaluation.

## 2023-07-22 NOTE — ED Triage Notes (Signed)
 Patient c/o chest tightness x 2-3 days.  Patient c/o worsening pain in shoulder blades with deep breathing.

## 2023-07-22 NOTE — ED Notes (Signed)
 Awaiting dispo. IV dc'd per pt request

## 2023-10-17 ENCOUNTER — Ambulatory Visit: Admitting: Family Medicine

## 2023-10-17 ENCOUNTER — Ambulatory Visit: Admitting: Internal Medicine
